# Patient Record
Sex: Female | Born: 1955 | Race: White | Hispanic: Yes | Marital: Married | Smoking: Never smoker
Health system: Southern US, Community
[De-identification: ages and names within clinical notes are randomized; demographics above are authoritative.]

---

## 2005-09-17 ENCOUNTER — Emergency Department: Payer: Self-pay | Admitting: Emergency Medicine

## 2005-09-18 ENCOUNTER — Ambulatory Visit: Payer: Self-pay | Admitting: Emergency Medicine

## 2005-09-26 ENCOUNTER — Ambulatory Visit: Payer: Self-pay | Admitting: General Surgery

## 2006-02-12 ENCOUNTER — Emergency Department: Payer: Self-pay | Admitting: Emergency Medicine

## 2008-02-17 ENCOUNTER — Ambulatory Visit: Payer: Self-pay | Admitting: Certified Nurse Midwife

## 2008-09-10 ENCOUNTER — Emergency Department: Payer: Self-pay | Admitting: Emergency Medicine

## 2013-01-21 HISTORY — PX: BREAST BIOPSY: SHX20

## 2013-10-27 ENCOUNTER — Ambulatory Visit: Payer: Self-pay

## 2013-12-09 ENCOUNTER — Ambulatory Visit: Payer: Self-pay

## 2013-12-15 ENCOUNTER — Ambulatory Visit: Payer: Self-pay

## 2014-05-06 ENCOUNTER — Other Ambulatory Visit: Payer: Self-pay | Admitting: Oncology

## 2014-05-06 DIAGNOSIS — R921 Mammographic calcification found on diagnostic imaging of breast: Secondary | ICD-10-CM

## 2014-05-16 LAB — SURGICAL PATHOLOGY

## 2014-06-03 ENCOUNTER — Ambulatory Visit: Admission: RE | Admit: 2014-06-03 | Payer: Self-pay | Source: Ambulatory Visit

## 2014-06-03 ENCOUNTER — Other Ambulatory Visit: Payer: Self-pay

## 2014-06-16 ENCOUNTER — Ambulatory Visit: Payer: Self-pay

## 2014-06-16 ENCOUNTER — Ambulatory Visit
Admission: RE | Admit: 2014-06-16 | Discharge: 2014-06-16 | Disposition: A | Discharge status: Home / Self Care | Payer: Self-pay | Source: Ambulatory Visit | Attending: Oncology | Admitting: Oncology

## 2014-06-16 ENCOUNTER — Other Ambulatory Visit: Payer: Self-pay | Admitting: Oncology

## 2014-06-16 DIAGNOSIS — R921 Mammographic calcification found on diagnostic imaging of breast: Secondary | ICD-10-CM

## 2014-06-22 ENCOUNTER — Encounter: Payer: Self-pay | Admitting: *Deleted

## 2014-06-22 NOTE — Progress Notes (Signed)
Talked to patient yesterday via the interpreter [place]. Patient with birads 3 mammogram.  Next appointment due for annual and 6 month f/u on 12/14/14 @ 8:00 in the [facility] clinic.  Will put in order at that time.

## 2014-09-20 ENCOUNTER — Telehealth: Payer: Self-pay | Admitting: *Deleted

## 2014-09-30 ENCOUNTER — Other Ambulatory Visit: Payer: Self-pay | Admitting: Internal Medicine

## 2014-09-30 DIAGNOSIS — R928 Other abnormal and inconclusive findings on diagnostic imaging of breast: Secondary | ICD-10-CM

## 2014-12-13 ENCOUNTER — Ambulatory Visit
Admission: RE | Admit: 2014-12-13 | Discharge: 2014-12-13 | Disposition: A | Discharge status: Home / Self Care | Payer: No Typology Code available for payment source | Source: Ambulatory Visit | Attending: Internal Medicine | Admitting: Internal Medicine

## 2014-12-13 DIAGNOSIS — R928 Other abnormal and inconclusive findings on diagnostic imaging of breast: Secondary | ICD-10-CM | POA: Diagnosis present

## 2014-12-13 DIAGNOSIS — R921 Mammographic calcification found on diagnostic imaging of breast: Secondary | ICD-10-CM | POA: Diagnosis not present

## 2014-12-21 ENCOUNTER — Ambulatory Visit: Payer: No Typology Code available for payment source

## 2015-11-21 ENCOUNTER — Other Ambulatory Visit: Payer: Self-pay | Admitting: Internal Medicine

## 2015-11-21 DIAGNOSIS — Z1231 Encounter for screening mammogram for malignant neoplasm of breast: Secondary | ICD-10-CM

## 2015-12-27 ENCOUNTER — Ambulatory Visit
Admission: RE | Admit: 2015-12-27 | Discharge: 2015-12-27 | Disposition: A | Discharge status: Home / Self Care | Payer: BLUE CROSS/BLUE SHIELD | Source: Ambulatory Visit | Attending: Internal Medicine | Admitting: Internal Medicine

## 2015-12-27 DIAGNOSIS — Z1231 Encounter for screening mammogram for malignant neoplasm of breast: Secondary | ICD-10-CM | POA: Insufficient documentation

## 2016-11-27 ENCOUNTER — Other Ambulatory Visit: Payer: Self-pay | Admitting: Internal Medicine

## 2016-11-27 DIAGNOSIS — Z1231 Encounter for screening mammogram for malignant neoplasm of breast: Secondary | ICD-10-CM

## 2016-12-31 ENCOUNTER — Ambulatory Visit
Admission: RE | Admit: 2016-12-31 | Discharge: 2016-12-31 | Disposition: A | Discharge status: Home / Self Care | Payer: BLUE CROSS/BLUE SHIELD | Source: Ambulatory Visit | Attending: Internal Medicine | Admitting: Internal Medicine

## 2016-12-31 DIAGNOSIS — Z1231 Encounter for screening mammogram for malignant neoplasm of breast: Secondary | ICD-10-CM | POA: Diagnosis present

## 2017-12-12 ENCOUNTER — Other Ambulatory Visit: Payer: Self-pay | Admitting: Internal Medicine

## 2017-12-12 DIAGNOSIS — Z1231 Encounter for screening mammogram for malignant neoplasm of breast: Secondary | ICD-10-CM

## 2018-01-30 ENCOUNTER — Ambulatory Visit
Admission: RE | Admit: 2018-01-30 | Discharge: 2018-01-30 | Disposition: A | Discharge status: Home / Self Care | Payer: BLUE CROSS/BLUE SHIELD | Source: Ambulatory Visit | Attending: Internal Medicine | Admitting: Internal Medicine

## 2018-01-30 DIAGNOSIS — Z1231 Encounter for screening mammogram for malignant neoplasm of breast: Secondary | ICD-10-CM | POA: Diagnosis present

## 2018-07-31 ENCOUNTER — Other Ambulatory Visit: Payer: Self-pay | Admitting: Internal Medicine

## 2018-07-31 DIAGNOSIS — G500 Trigeminal neuralgia: Secondary | ICD-10-CM

## 2018-08-13 ENCOUNTER — Other Ambulatory Visit: Payer: Self-pay

## 2018-08-13 ENCOUNTER — Ambulatory Visit
Admission: RE | Admit: 2018-08-13 | Discharge: 2018-08-13 | Disposition: A | Discharge status: Home / Self Care | Payer: BLUE CROSS/BLUE SHIELD | Source: Ambulatory Visit | Attending: Internal Medicine | Admitting: Internal Medicine

## 2018-08-13 DIAGNOSIS — G500 Trigeminal neuralgia: Secondary | ICD-10-CM | POA: Diagnosis present

## 2018-08-13 LAB — POCT I-STAT CREATININE: Creatinine, Ser: 0.8 mg/dL (ref 0.44–1.00)

## 2018-08-13 MED ORDER — GADOBUTROL 1 MMOL/ML IV SOLN
8.0000 mL | Freq: Once | INTRAVENOUS | Status: AC | PRN
  Administered 2018-08-13: 7.5 mL via INTRAVENOUS

## 2019-07-09 ENCOUNTER — Other Ambulatory Visit: Payer: Self-pay | Admitting: Internal Medicine

## 2019-07-09 DIAGNOSIS — Z1231 Encounter for screening mammogram for malignant neoplasm of breast: Secondary | ICD-10-CM

## 2019-07-22 ENCOUNTER — Ambulatory Visit
Admission: RE | Admit: 2019-07-22 | Discharge: 2019-07-22 | Disposition: A | Discharge status: Home / Self Care | Payer: BLUE CROSS/BLUE SHIELD | Source: Ambulatory Visit | Attending: Internal Medicine | Admitting: Internal Medicine

## 2019-07-22 DIAGNOSIS — Z1231 Encounter for screening mammogram for malignant neoplasm of breast: Secondary | ICD-10-CM | POA: Diagnosis present

## 2020-07-18 IMAGING — MG DIGITAL SCREENING BILAT W/ TOMO W/ CAD
8 series · 8 of 24 positions shown · non-contrast
Comparison: Previous exam(s).

CLINICAL DATA: Screening.

EXAM:
DIGITAL SCREENING BILATERAL MAMMOGRAM WITH TOMO AND CAD

[R CC synth-2D]
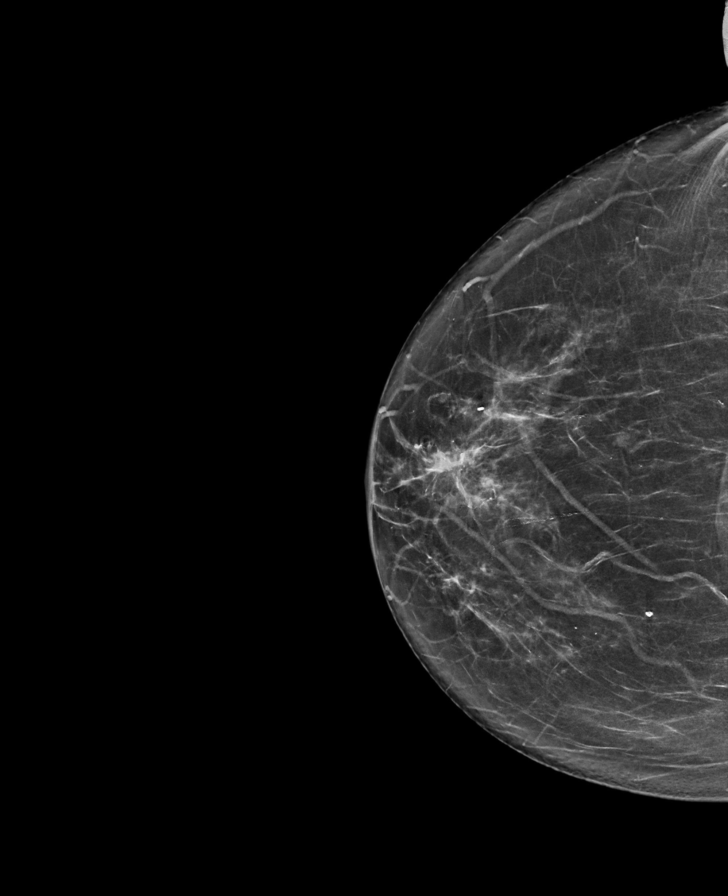

[L CC synth-2D]
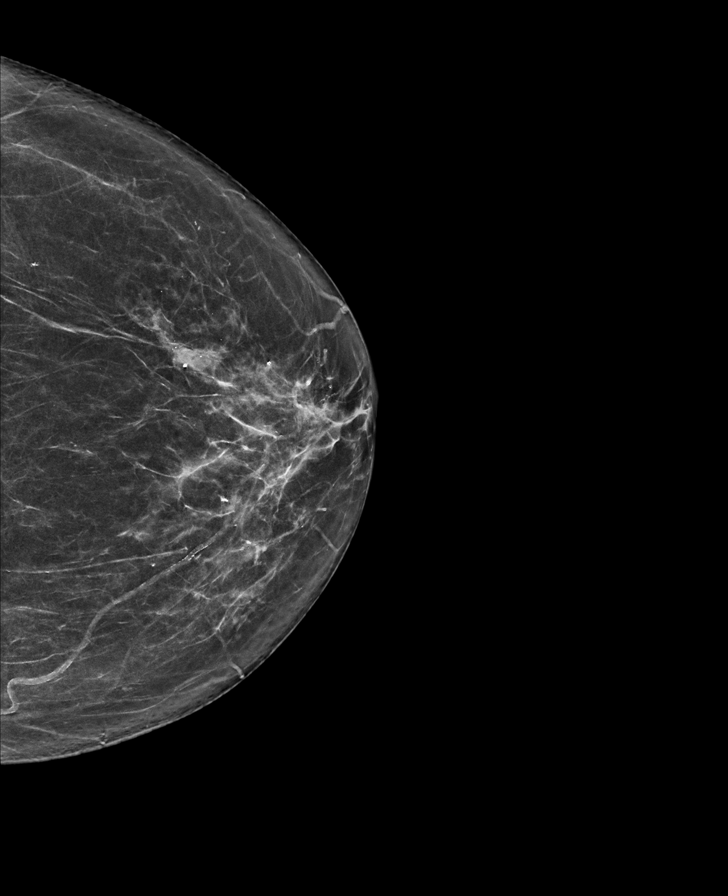

[R MLO synth-2D]
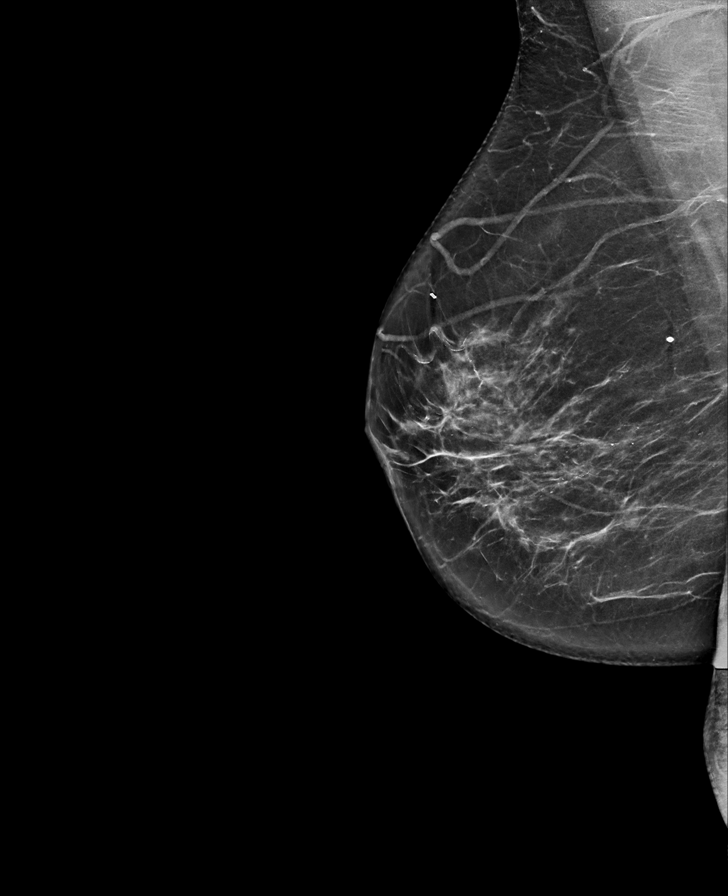

[L MLO synth-2D]
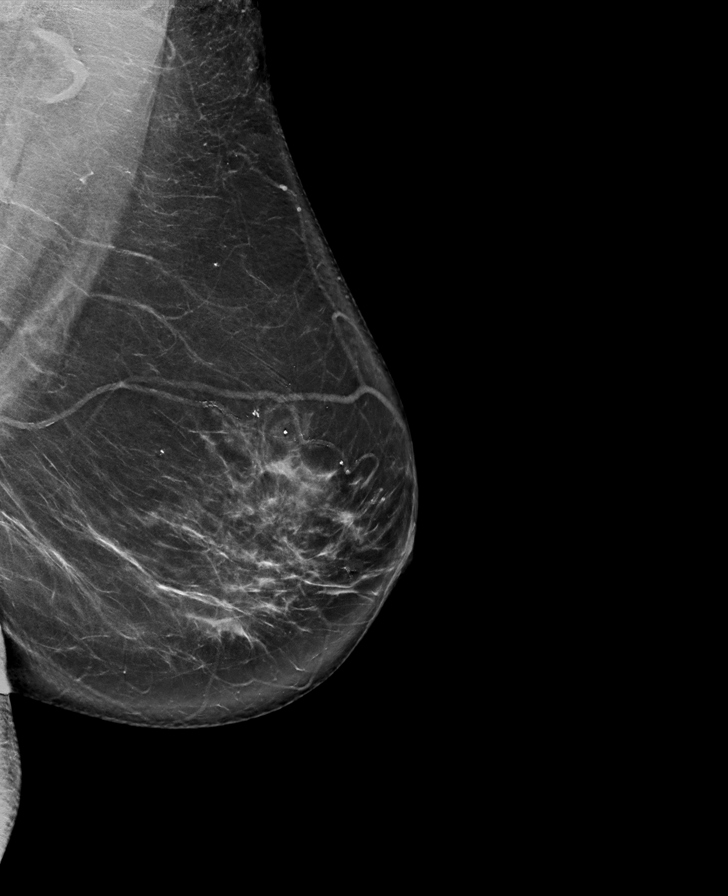

[L MLO tomo · tomo slice 44/87.0]
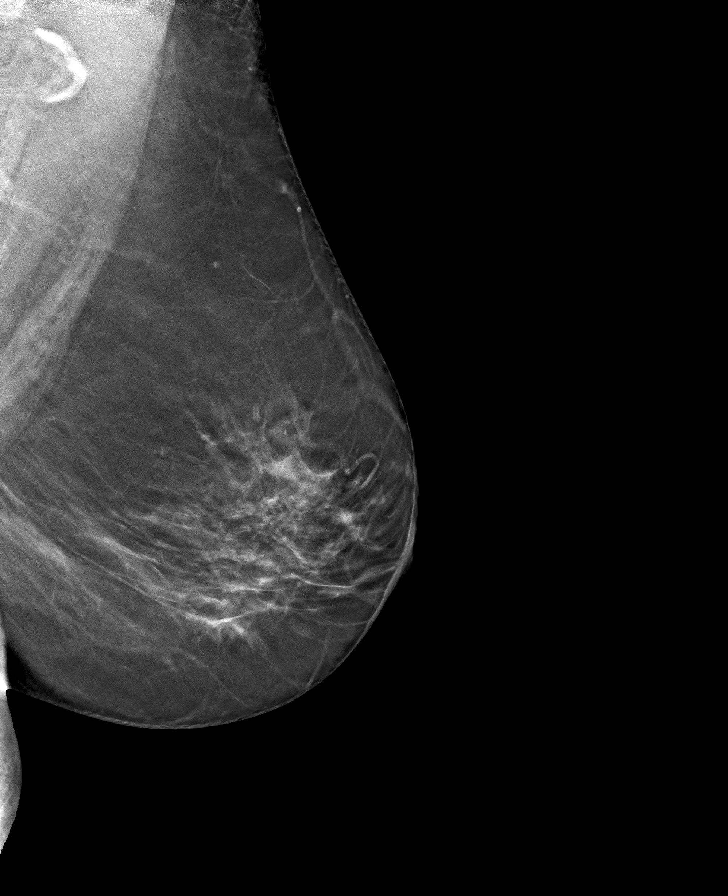

[R CC tomo · tomo slice 36/71.0]
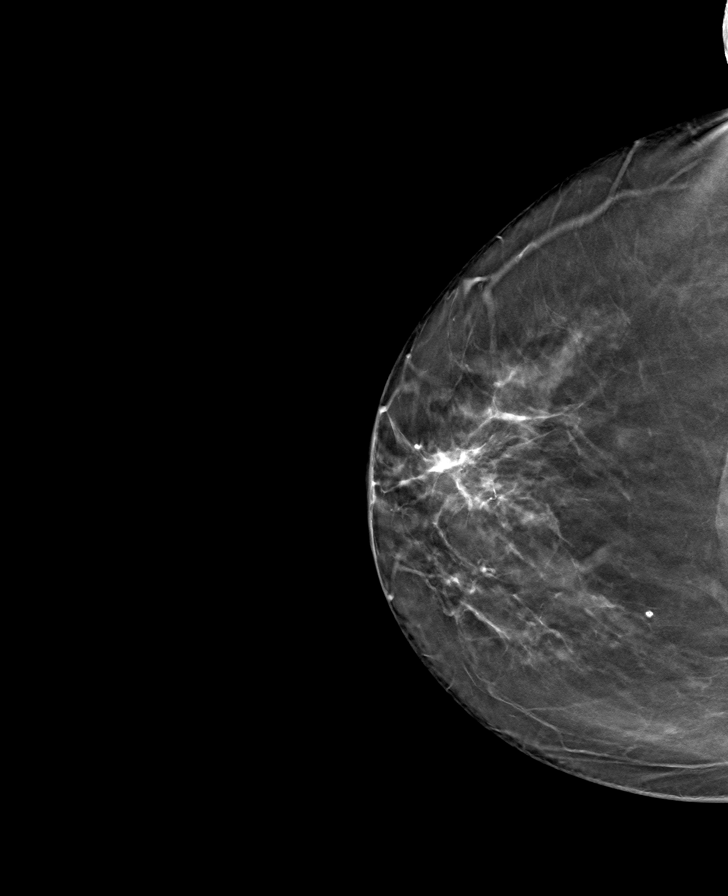

[R MLO tomo · tomo slice 40/79.0]
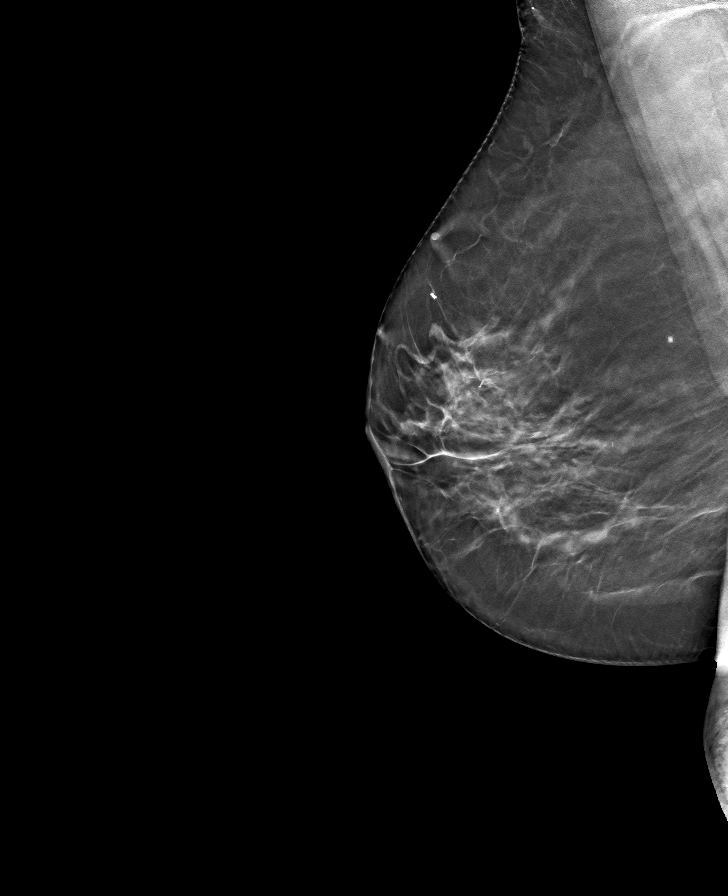

[L CC tomo · tomo slice 35/70.0]
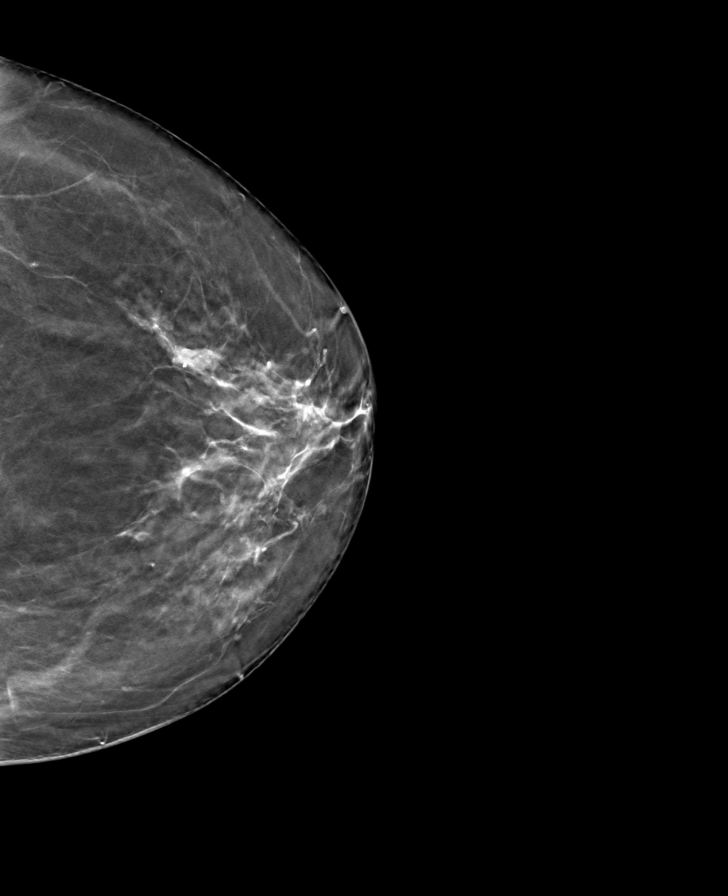

[8 of 24 positions shown; findings below may reference images not displayed]

ACR Breast Density Category b: There are scattered areas of
fibroglandular density.
FINDINGS: There are no findings suspicious for malignancy. Images were
processed with CAD.
IMPRESSION: No mammographic evidence of malignancy. A result letter of this
screening mammogram will be mailed directly to the patient.

RECOMMENDATION:
Screening mammogram in one year. (Code:[id number])

BI-RADS CATEGORY  1: Negative.

## 2021-03-16 ENCOUNTER — Other Ambulatory Visit: Payer: Self-pay

## 2021-03-16 ENCOUNTER — Emergency Department
Admission: EM | Admit: 2021-03-16 | Discharge: 2021-03-16 | Disposition: A | Discharge status: Home / Self Care | Payer: No Typology Code available for payment source | Attending: Emergency Medicine | Admitting: Emergency Medicine

## 2021-03-16 ENCOUNTER — Encounter: Payer: Self-pay | Admitting: Emergency Medicine

## 2021-03-16 DIAGNOSIS — N390 Urinary tract infection, site not specified: Secondary | ICD-10-CM | POA: Insufficient documentation

## 2021-03-16 DIAGNOSIS — R300 Dysuria: Secondary | ICD-10-CM | POA: Diagnosis present

## 2021-03-16 LAB — CBC WITH DIFFERENTIAL/PLATELET
Abs Immature Granulocytes: 0.01 10*3/uL (ref 0.00–0.07)
Basophils Absolute: 0 10*3/uL (ref 0.0–0.1)
Basophils Relative: 1 %
Eosinophils Absolute: 0.2 10*3/uL (ref 0.0–0.5)
Eosinophils Relative: 2 %
HCT: 40.4 % (ref 36.0–46.0)
Hemoglobin: 13.3 g/dL (ref 12.0–15.0)
Immature Granulocytes: 0 %
Lymphocytes Relative: 32 %
Lymphs Abs: 2.3 10*3/uL (ref 0.7–4.0)
MCH: 26.8 pg (ref 26.0–34.0)
MCHC: 32.9 g/dL (ref 30.0–36.0)
MCV: 81.5 fL (ref 80.0–100.0)
Monocytes Absolute: 0.6 10*3/uL (ref 0.1–1.0)
Monocytes Relative: 8 %
Neutro Abs: 4.2 10*3/uL (ref 1.7–7.7)
Neutrophils Relative %: 57 %
Platelets: 211 10*3/uL (ref 150–400)
RBC: 4.96 MIL/uL (ref 3.87–5.11)
RDW: 13.6 % (ref 11.5–15.5)
WBC: 7.3 10*3/uL (ref 4.0–10.5)
nRBC: 0 % (ref 0.0–0.2)

## 2021-03-16 LAB — URINALYSIS, ROUTINE W REFLEX MICROSCOPIC
Bilirubin Urine: NEGATIVE
Glucose, UA: NEGATIVE mg/dL
Ketones, ur: NEGATIVE mg/dL
Nitrite: NEGATIVE
Protein, ur: NEGATIVE mg/dL
Specific Gravity, Urine: 1.003 — ABNORMAL LOW (ref 1.005–1.030)
Squamous Epithelial / HPF: NONE SEEN (ref 0–5)
WBC, UA: >50 WBC/hpf — ABNORMAL HIGH (ref 0–5)
pH: 7 (ref 5.0–8.0)

## 2021-03-16 LAB — COMPREHENSIVE METABOLIC PANEL
ALT: 26 U/L (ref 0–44)
AST: 23 U/L (ref 15–41)
Albumin: 4.2 g/dL (ref 3.5–5.0)
Alkaline Phosphatase: 100 U/L (ref 38–126)
Anion gap: 8 (ref 5–15)
BUN: 14 mg/dL (ref 8–23)
CO2: 26 mmol/L (ref 22–32)
Calcium: 8.9 mg/dL (ref 8.9–10.3)
Chloride: 105 mmol/L (ref 98–111)
Creatinine, Ser: 0.73 mg/dL (ref 0.44–1.00)
GFR, Estimated: >60 mL/min (ref >60)
Glucose, Bld: 110 mg/dL — ABNORMAL HIGH (ref 70–99)
Potassium: 3.6 mmol/L (ref 3.5–5.1)
Sodium: 139 mmol/L (ref 135–145)
Total Bilirubin: 0.6 mg/dL (ref 0.3–1.2)
Total Protein: 7.3 g/dL (ref 6.5–8.1)

## 2021-03-16 LAB — LIPASE, BLOOD: Lipase: 42 U/L (ref 11–51)

## 2021-03-16 MED ORDER — CEPHALEXIN 500 MG PO CAPS
500.0000 mg | ORAL_CAPSULE | Freq: Once | ORAL | Status: AC
  Administered 2021-03-16: 500 mg via ORAL
  Filled 2021-03-16: qty 1

## 2021-03-16 MED ORDER — CEPHALEXIN 500 MG PO CAPS: 500.0000 mg | ORAL_CAPSULE | Freq: Three times a day (TID) | ORAL | 0 refills | Status: AC

## 2021-03-16 NOTE — ED Triage Notes (Addendum)
Patient ambulatory to triage with steady gait, without difficulty or distress noted; per stratus video interpreter, pt reports dysuria x 15days accomp by lower abd/back pain

## 2021-03-16 NOTE — Discharge Instructions (Signed)
Take the antibiotic 3 times a day for 7 days. Follow-up with your primary provider or return if needed. Return to the ED if needed.   Tome el antibitico 3 veces al da durante 7 [address]  [address]. Haga un seguimiento con su proveedor principal o regrese si es necesario. Regrese al servicio de urgencias si es necesario.

## 2021-03-16 NOTE — ED Provider Notes (Signed)
[facility] Emergency Department Provider Note     Event Date/Time   First MD Initiated Contact with Patient 03/16/21 [phone]     (approximate)   History   Abdominal Pain   HPI  History limited by Spanish language.  Tele-interpreter used for interview and exam.  Mary Parrish is a 66 y.o. female with a medical history including trigeminal neuralgia, presents to the ED with 2 weeks of dysuria and low back pain.  Patient denies any associated nausea, vomiting, or diarrhea patient also denies any fevers, chills, sweats, chest pain, shortness of breath.  She presents without complaints of urinary retention, or hematuria, but notes urinary frequency and nocturia.     Physical Exam   Triage Vital Signs: ED Triage Vitals  Enc Vitals Group     BP 03/16/21 0538 (!) 152/100     Pulse Rate 03/16/21 0538 75     Resp 03/16/21 0538 18     Temp --      Temp Source 03/16/21 0538 Oral     SpO2 03/16/21 0538 99 %     Weight 03/16/21 0539 188 lb (85.3 kg)     Height 03/16/21 0539 5\' 5"  (1.651 m)     Head Circumference --      Peak Flow --      Pain Score 03/16/21 0538 8     Pain Loc --      Pain Edu? --      Excl. in [place]? --     Most recent vital signs: Vitals:   03/16/21 0538 03/16/21 0744  BP: (!) 152/100 (!) 153/73  Pulse: 75 62  Resp: 18 18  Temp:  98.3 F (36.8 C)  SpO2: 99% 99%    General Awake, no distress.  CV:  Good peripheral perfusion.  RESP:  Normal effort.  ABD:  No distention. Soft, nontender. Normal bowel sounds. Mild right flank pain   ED Results / Procedures / Treatments   Labs (all labs ordered are listed, but only abnormal results are displayed) Labs Reviewed  COMPREHENSIVE METABOLIC PANEL - Abnormal; Notable for the following components:      Result Value   Glucose, Bld 110 (*)    All other components within normal limits  URINALYSIS, ROUTINE W REFLEX MICROSCOPIC - Abnormal; Notable for the following components:    Color, Urine YELLOW (*)    APPearance TURBID (*)    Specific Gravity, Urine 1.003 (*)    Hgb urine dipstick SMALL (*)    Leukocytes,Ua LARGE (*)    WBC, UA >50 (*)    Bacteria, UA RARE (*)    All other components within normal limits  URINE CULTURE  CBC WITH DIFFERENTIAL/PLATELET  LIPASE, BLOOD     EKG    RADIOLOGY  No results found.   PROCEDURES:  Critical Care performed: No  Procedures   MEDICATIONS ORDERED IN ED: Medications  cephALEXin (KEFLEX) capsule 500 mg (500 mg Oral Given 03/16/21 0815)     IMPRESSION / MDM / ASSESSMENT AND PLAN / ED COURSE  I reviewed the triage vital signs and the nursing notes.                              Differential diagnosis includes, but is not limited to, cystitis, pyelonephritis, nephrolithiasis, urinary retention, lumbar strain  Patient ED evaluation of 2 weeks of intermittent dysuria described as burning, as well as some mild  right flank pain.  Patient presents afebrile with vital signs otherwise stable, and in no acute distress.  He is evaluated for complaints, found to have a reassuring work-up overall.  No CBC evidence of leukocytosis or critical anemia.  BMP is reassuring as it shows no acute kidney injury or electrolyte abnormalities.  UA does show some leukocyturia without significant bacteriuria.  Urine culture is pending at this time.  Patient's diagnosis is consistent with acute cystitis, presumed infectious. Patient will be discharged home with prescriptions for Keflex.  Patient is stable at this time without signs of urosepsis or acute pyelonephritis, warranting admission, feel she is stable for outpatient management.  As such, patient is to follow up with her primary provider as needed or otherwise directed. Patient is given ED precautions to return to the ED for any worsening or new symptoms.  FINAL CLINICAL IMPRESSION(S) / ED DIAGNOSES   Final diagnoses:  Lower urinary tract infectious disease     Rx / DC  Orders   ED Discharge Orders          Ordered    cephALEXin (KEFLEX) 500 MG capsule  3 times daily        03/16/21 [id number]             Note:  This document was prepared using Dragon voice recognition software and may include unintentional dictation errors.    Melvenia Needles, PA-C 03/16/21 0816    Carrie Mew, MD 03/18/21 [phone]

## 2021-03-18 LAB — URINE CULTURE: Culture: >=100000 — AB

## 2021-03-19 ENCOUNTER — Telehealth: Payer: Self-pay

## 2021-03-19 NOTE — Telephone Encounter (Signed)
This patient presented to ED on 2/24 with ongoing flank pain and dysuria x2 weeks. UA was esterase positive and nitrite negative.  Urine cultures were then collected and patient was discharged with Cephalexin x7 days for UTI. Cultures now growing ESBL producing E.Coli resistant to keflex but susceptible to ciprofloxacin. Called patient to see if experiencing ongoing symptoms of UTI. Patient says she feels much better and no longer has vaginal pain or flank pain. I told her if symptoms were to reoccur, to contact the [facility] ED. MD and I have agreed to hold off on antibiotics given symptom improvement.  Selinda Eon, PharmD, MS PGPM Clinical Pharmacist 03/19/2021 12:05 PM

## 2021-06-21 ENCOUNTER — Other Ambulatory Visit: Payer: Self-pay | Admitting: Internal Medicine

## 2021-06-21 DIAGNOSIS — Z1231 Encounter for screening mammogram for malignant neoplasm of breast: Secondary | ICD-10-CM

## 2021-10-16 DIAGNOSIS — E538 Deficiency of other specified B group vitamins: Secondary | ICD-10-CM | POA: Diagnosis not present

## 2021-10-16 DIAGNOSIS — R251 Tremor, unspecified: Secondary | ICD-10-CM | POA: Diagnosis not present

## 2021-10-16 DIAGNOSIS — G500 Trigeminal neuralgia: Secondary | ICD-10-CM | POA: Diagnosis not present

## 2021-10-16 DIAGNOSIS — E559 Vitamin D deficiency, unspecified: Secondary | ICD-10-CM | POA: Diagnosis not present

## 2021-10-16 DIAGNOSIS — R519 Headache, unspecified: Secondary | ICD-10-CM | POA: Diagnosis not present

## 2021-10-16 DIAGNOSIS — Z79899 Other long term (current) drug therapy: Secondary | ICD-10-CM | POA: Diagnosis not present

## 2021-11-05 ENCOUNTER — Other Ambulatory Visit: Payer: Self-pay | Admitting: Physician Assistant

## 2021-11-05 DIAGNOSIS — G500 Trigeminal neuralgia: Secondary | ICD-10-CM

## 2021-11-05 DIAGNOSIS — S83232A Complex tear of medial meniscus, current injury, left knee, initial encounter: Secondary | ICD-10-CM | POA: Diagnosis not present

## 2021-11-05 DIAGNOSIS — M25562 Pain in left knee: Secondary | ICD-10-CM | POA: Diagnosis not present

## 2021-11-05 DIAGNOSIS — R519 Headache, unspecified: Secondary | ICD-10-CM

## 2021-11-06 ENCOUNTER — Other Ambulatory Visit: Payer: Self-pay | Admitting: Orthopedic Surgery

## 2021-11-06 DIAGNOSIS — S83232A Complex tear of medial meniscus, current injury, left knee, initial encounter: Secondary | ICD-10-CM

## 2021-11-15 DIAGNOSIS — M25562 Pain in left knee: Secondary | ICD-10-CM | POA: Diagnosis not present

## 2021-11-26 DIAGNOSIS — Z79899 Other long term (current) drug therapy: Secondary | ICD-10-CM | POA: Diagnosis not present

## 2021-11-26 DIAGNOSIS — R519 Headache, unspecified: Secondary | ICD-10-CM | POA: Diagnosis not present

## 2021-11-26 DIAGNOSIS — R251 Tremor, unspecified: Secondary | ICD-10-CM | POA: Diagnosis not present

## 2021-11-26 DIAGNOSIS — E559 Vitamin D deficiency, unspecified: Secondary | ICD-10-CM | POA: Diagnosis not present

## 2021-11-26 DIAGNOSIS — G500 Trigeminal neuralgia: Secondary | ICD-10-CM | POA: Diagnosis not present

## 2021-12-24 DIAGNOSIS — G250 Essential tremor: Secondary | ICD-10-CM | POA: Diagnosis not present

## 2021-12-24 DIAGNOSIS — E538 Deficiency of other specified B group vitamins: Secondary | ICD-10-CM | POA: Diagnosis not present

## 2021-12-24 DIAGNOSIS — G500 Trigeminal neuralgia: Secondary | ICD-10-CM | POA: Diagnosis not present

## 2021-12-24 DIAGNOSIS — M7021 Olecranon bursitis, right elbow: Secondary | ICD-10-CM | POA: Diagnosis not present

## 2021-12-27 ENCOUNTER — Encounter: Payer: Self-pay | Admitting: Physician Assistant

## 2021-12-27 DIAGNOSIS — G500 Trigeminal neuralgia: Secondary | ICD-10-CM

## 2022-02-03 ENCOUNTER — Emergency Department
Admission: EM | Admit: 2022-02-03 | Discharge: 2022-02-03 | Disposition: A | Discharge status: Home / Self Care | Payer: 59 | Attending: Emergency Medicine | Admitting: Emergency Medicine

## 2022-02-03 ENCOUNTER — Other Ambulatory Visit: Payer: Self-pay

## 2022-02-03 DIAGNOSIS — G894 Chronic pain syndrome: Secondary | ICD-10-CM | POA: Diagnosis not present

## 2022-02-03 DIAGNOSIS — G501 Atypical facial pain: Secondary | ICD-10-CM | POA: Diagnosis not present

## 2022-02-03 DIAGNOSIS — G500 Trigeminal neuralgia: Secondary | ICD-10-CM | POA: Insufficient documentation

## 2022-02-03 LAB — CBC WITH DIFFERENTIAL/PLATELET
Abs Immature Granulocytes: 0.07 10*3/uL (ref 0.00–0.07)
Basophils Absolute: 0 10*3/uL (ref 0.0–0.1)
Basophils Relative: 0 %
Eosinophils Absolute: 0 10*3/uL (ref 0.0–0.5)
Eosinophils Relative: 0 %
HCT: 42.6 % (ref 36.0–46.0)
Hemoglobin: 13.8 g/dL (ref 12.0–15.0)
Immature Granulocytes: 1 %
Lymphocytes Relative: 23 %
Lymphs Abs: 2.7 10*3/uL (ref 0.7–4.0)
MCH: 26.4 pg (ref 26.0–34.0)
MCHC: 32.4 g/dL (ref 30.0–36.0)
MCV: 81.5 fL (ref 80.0–100.0)
Monocytes Absolute: 0.7 10*3/uL (ref 0.1–1.0)
Monocytes Relative: 6 %
Neutro Abs: 8.3 10*3/uL — ABNORMAL HIGH (ref 1.7–7.7)
Neutrophils Relative %: 70 %
Platelets: 254 10*3/uL (ref 150–400)
RBC: 5.23 MIL/uL — ABNORMAL HIGH (ref 3.87–5.11)
RDW: 14.2 % (ref 11.5–15.5)
WBC: 11.8 10*3/uL — ABNORMAL HIGH (ref 4.0–10.5)
nRBC: 0 % (ref 0.0–0.2)

## 2022-02-03 LAB — BASIC METABOLIC PANEL
Anion gap: 7 (ref 5–15)
BUN: 16 mg/dL (ref 8–23)
CO2: 23 mmol/L (ref 22–32)
Calcium: 9.1 mg/dL (ref 8.9–10.3)
Chloride: 107 mmol/L (ref 98–111)
Creatinine, Ser: 0.63 mg/dL (ref 0.44–1.00)
GFR, Estimated: >60 mL/min (ref >60)
Glucose, Bld: 83 mg/dL (ref 70–99)
Potassium: 3.8 mmol/L (ref 3.5–5.1)
Sodium: 137 mmol/L (ref 135–145)

## 2022-02-03 MED ORDER — HYDROCODONE-ACETAMINOPHEN 5-325 MG PO TABS: 1.0000 | ORAL_TABLET | Freq: Four times a day (QID) | ORAL | 0 refills | Status: AC | PRN

## 2022-02-03 MED ORDER — ACETAMINOPHEN 325 MG PO TABS
650.0000 mg | ORAL_TABLET | Freq: Once | ORAL | Status: AC
  Administered 2022-02-03: 650 mg via ORAL
  Filled 2022-02-03: qty 2

## 2022-02-03 NOTE — Discharge Instructions (Signed)
Call and make an appointment with Dr. Melrose Nakayama at [facility] Neurology department. The medication prescribed for you today is for pain only.  Do not drive while taking this.  Continue taking your regular medications.

## 2022-02-03 NOTE — ED Provider Notes (Signed)
[name] Provider Note    None    (approximate)   History   Facial Pain   HPI Spanish interpreter [place] at [facility]. Mary Parrish is a 67 y.o. female   presents to the ED with complaint of left-sided facial pain and a history of trigeminal neuralgia which she is seeing neurology at [name].  Looking through her records she is not currently taking all the medications listed at [name] for this.  She states nothing is changed but occasionally she does have left-sided facial pain that is not controlled with over-the-counter medication.  Denies any visual changes, dizziness, fever, chills or balance issues.      Physical Exam   Triage Vital Signs: ED Triage Vitals  Enc Vitals Group     BP 02/03/22 0843 (!) 154/77     Pulse Rate 02/03/22 0843 65     Resp 02/03/22 0843 16     Temp 02/03/22 0843 (!) 97.5 F (36.4 C)     Temp Source 02/03/22 0843 Oral     SpO2 02/03/22 0843 97 %     Weight 02/03/22 0855 195 lb (88.5 kg)     Height 02/03/22 0855 5' 1.02" (1.55 m)     Head Circumference --      Peak Flow --      Pain Score 02/03/22 0852 6     Pain Loc --      Pain Edu? --      Excl. in [place]? --     Most recent vital signs: Vitals:   02/03/22 0843  BP: (!) 154/77  Pulse: 65  Resp: 16  Temp: (!) 97.5 F (36.4 C)  SpO2: 97%     General: Awake, no distress.  Patient is alert, talkative, cooperative. CV:  Good peripheral perfusion.  Resp:  Normal effort.  Lungs are clear bilaterally. Abd:  No distention.  Other:  Examination of the face there is no drooping or deformity present.  Nontender to palpation temporal or periorbital area.  Skin is intact.  No erythema or discoloration present.  PERRLA, EOMI's.   ED Results / Procedures / Treatments   Labs (all labs ordered are listed, but only abnormal results are displayed) Labs Reviewed  CBC WITH DIFFERENTIAL/PLATELET - Abnormal; Notable for the following components:       Result Value   WBC 11.8 (*)    RBC 5.23 (*)    Neutro Abs 8.3 (*)    All other components within normal limits  BASIC METABOLIC PANEL       PROCEDURES:  Critical Care performed:   Procedures   MEDICATIONS ORDERED IN ED: Medications  acetaminophen (TYLENOL) tablet 650 mg (650 mg Oral Given 02/03/22 1023)     IMPRESSION / MDM / ASSESSMENT AND PLAN / ED COURSE  I reviewed the triage vital signs and the nursing notes.   Differential diagnosis includes, but is not limited to, exacerbation of her trigeminal neuralgia pain.  67 year old female presents to the ED with complaint of chronic left-sided facial pain secondary to trigeminal neuralgia for which she is being seen at the neurology department at [name].  Patient currently continues to take medication as prescribed and listed in the notes for [name].  These were reviewed and she acknowledges that she still continues to take them.  Lab work was reassuring and patient was made aware with the Spanish interpreter for [name] being used to convey this.  I explained that there  is very little that can be done since this is a chronic issue however a small quantity of pain medication could be sent to the pharmacy to take only as needed and she is aware that she cannot drive or operate machinery while taking this medication.  She is strongly encouraged to call Monday to the neurology department and let them know that she continues to have pain despite her current medications.      Patient's presentation is most consistent with acute complicated illness / injury requiring diagnostic workup.  FINAL CLINICAL IMPRESSION(S) / ED DIAGNOSES   Final diagnoses:  Trigeminal neuralgia of left side of face  Chronic pain syndrome     Rx / DC Orders   ED Discharge Orders          Ordered    HYDROcodone-acetaminophen (NORCO/VICODIN) 5-325 MG tablet  Every 6 hours PRN        02/03/22 1128             Note:  This  document was prepared using Dragon voice recognition software and may include unintentional dictation errors.   Johnn Hai, PA-C 02/03/22 1325    Nance Pear, MD 02/03/22 [phone]

## 2022-02-03 NOTE — ED Triage Notes (Addendum)
Pt states being seen by a neurologist for trigeminal pain in her nerve. Pt states her left face has been hurting for the last 24 hours, and now it is painful to talk or touch. Pt states difficulty sleeping due to the pain as well. Pt states the pain comes and goes and that is why she is being seen by a neurologist.   [facility] interpreter # [phone]

## 2022-02-12 DIAGNOSIS — R251 Tremor, unspecified: Secondary | ICD-10-CM | POA: Diagnosis not present

## 2022-02-12 DIAGNOSIS — Z79899 Other long term (current) drug therapy: Secondary | ICD-10-CM | POA: Diagnosis not present

## 2022-02-12 DIAGNOSIS — R519 Headache, unspecified: Secondary | ICD-10-CM | POA: Diagnosis not present

## 2022-02-12 DIAGNOSIS — G500 Trigeminal neuralgia: Secondary | ICD-10-CM | POA: Diagnosis not present

## 2022-02-12 DIAGNOSIS — M545 Low back pain, unspecified: Secondary | ICD-10-CM | POA: Diagnosis not present

## 2022-02-12 DIAGNOSIS — G8929 Other chronic pain: Secondary | ICD-10-CM | POA: Diagnosis not present

## 2022-02-15 ENCOUNTER — Other Ambulatory Visit: Payer: Self-pay | Admitting: Physician Assistant

## 2022-02-15 DIAGNOSIS — R519 Headache, unspecified: Secondary | ICD-10-CM

## 2022-02-15 DIAGNOSIS — G500 Trigeminal neuralgia: Secondary | ICD-10-CM

## 2022-03-05 ENCOUNTER — Ambulatory Visit
Admission: RE | Admit: 2022-03-05 | Discharge: 2022-03-05 | Disposition: A | Discharge status: Home / Self Care | Payer: 59 | Source: Ambulatory Visit | Attending: Physician Assistant | Admitting: Physician Assistant

## 2022-03-05 DIAGNOSIS — R519 Headache, unspecified: Secondary | ICD-10-CM

## 2022-03-05 DIAGNOSIS — G500 Trigeminal neuralgia: Secondary | ICD-10-CM

## 2022-03-05 MED ORDER — GADOPICLENOL 0.5 MMOL/ML IV SOLN
9.0000 mL | Freq: Once | INTRAVENOUS | Status: AC | PRN
  Administered 2022-03-05: 9 mL via INTRAVENOUS

## 2022-03-29 DIAGNOSIS — G500 Trigeminal neuralgia: Secondary | ICD-10-CM | POA: Diagnosis not present

## 2022-04-01 DIAGNOSIS — R519 Headache, unspecified: Secondary | ICD-10-CM | POA: Diagnosis not present

## 2022-04-01 DIAGNOSIS — G500 Trigeminal neuralgia: Secondary | ICD-10-CM | POA: Diagnosis not present

## 2022-04-01 DIAGNOSIS — R251 Tremor, unspecified: Secondary | ICD-10-CM | POA: Diagnosis not present

## 2022-05-14 DIAGNOSIS — G500 Trigeminal neuralgia: Secondary | ICD-10-CM | POA: Diagnosis not present

## 2022-05-14 DIAGNOSIS — Z510 Encounter for antineoplastic radiation therapy: Secondary | ICD-10-CM | POA: Diagnosis not present

## 2022-05-28 DIAGNOSIS — R350 Frequency of micturition: Secondary | ICD-10-CM | POA: Diagnosis not present

## 2022-05-28 DIAGNOSIS — R8290 Unspecified abnormal findings in urine: Secondary | ICD-10-CM | POA: Diagnosis not present

## 2022-05-28 DIAGNOSIS — R300 Dysuria: Secondary | ICD-10-CM | POA: Diagnosis not present

## 2022-07-01 DIAGNOSIS — G250 Essential tremor: Secondary | ICD-10-CM | POA: Diagnosis not present

## 2022-07-01 DIAGNOSIS — E559 Vitamin D deficiency, unspecified: Secondary | ICD-10-CM | POA: Diagnosis not present

## 2022-07-01 DIAGNOSIS — G500 Trigeminal neuralgia: Secondary | ICD-10-CM | POA: Diagnosis not present

## 2022-07-01 DIAGNOSIS — E538 Deficiency of other specified B group vitamins: Secondary | ICD-10-CM | POA: Diagnosis not present

## 2022-07-01 DIAGNOSIS — Z780 Asymptomatic menopausal state: Secondary | ICD-10-CM | POA: Diagnosis not present

## 2022-07-01 DIAGNOSIS — R2689 Other abnormalities of gait and mobility: Secondary | ICD-10-CM | POA: Diagnosis not present

## 2022-07-01 DIAGNOSIS — Z1231 Encounter for screening mammogram for malignant neoplasm of breast: Secondary | ICD-10-CM | POA: Diagnosis not present

## 2022-07-01 DIAGNOSIS — R519 Headache, unspecified: Secondary | ICD-10-CM | POA: Diagnosis not present

## 2022-07-04 ENCOUNTER — Other Ambulatory Visit: Payer: Self-pay

## 2022-07-04 DIAGNOSIS — Z1231 Encounter for screening mammogram for malignant neoplasm of breast: Secondary | ICD-10-CM

## 2022-07-04 DIAGNOSIS — E538 Deficiency of other specified B group vitamins: Secondary | ICD-10-CM | POA: Diagnosis not present

## 2022-07-04 DIAGNOSIS — Z0000 Encounter for general adult medical examination without abnormal findings: Secondary | ICD-10-CM | POA: Diagnosis not present

## 2022-07-04 DIAGNOSIS — E559 Vitamin D deficiency, unspecified: Secondary | ICD-10-CM | POA: Diagnosis not present

## 2022-07-08 DIAGNOSIS — M810 Age-related osteoporosis without current pathological fracture: Secondary | ICD-10-CM | POA: Diagnosis not present

## 2022-07-29 DIAGNOSIS — R29898 Other symptoms and signs involving the musculoskeletal system: Secondary | ICD-10-CM | POA: Diagnosis not present

## 2022-08-15 DIAGNOSIS — G500 Trigeminal neuralgia: Secondary | ICD-10-CM | POA: Diagnosis not present

## 2022-08-15 DIAGNOSIS — Z923 Personal history of irradiation: Secondary | ICD-10-CM | POA: Diagnosis not present

## 2022-10-07 DIAGNOSIS — G250 Essential tremor: Secondary | ICD-10-CM | POA: Diagnosis not present

## 2022-10-07 DIAGNOSIS — R29898 Other symptoms and signs involving the musculoskeletal system: Secondary | ICD-10-CM | POA: Diagnosis not present

## 2022-10-07 DIAGNOSIS — Z79899 Other long term (current) drug therapy: Secondary | ICD-10-CM | POA: Diagnosis not present

## 2022-10-07 DIAGNOSIS — G500 Trigeminal neuralgia: Secondary | ICD-10-CM | POA: Diagnosis not present

## 2022-10-07 DIAGNOSIS — R519 Headache, unspecified: Secondary | ICD-10-CM | POA: Diagnosis not present

## 2022-10-07 DIAGNOSIS — R2689 Other abnormalities of gait and mobility: Secondary | ICD-10-CM | POA: Diagnosis not present

## 2022-10-11 DIAGNOSIS — R300 Dysuria: Secondary | ICD-10-CM | POA: Diagnosis not present

## 2022-12-02 DIAGNOSIS — G500 Trigeminal neuralgia: Secondary | ICD-10-CM | POA: Diagnosis not present

## 2022-12-02 DIAGNOSIS — Z79899 Other long term (current) drug therapy: Secondary | ICD-10-CM | POA: Diagnosis not present

## 2022-12-02 DIAGNOSIS — G250 Essential tremor: Secondary | ICD-10-CM | POA: Diagnosis not present

## 2022-12-02 DIAGNOSIS — R519 Headache, unspecified: Secondary | ICD-10-CM | POA: Diagnosis not present

## 2022-12-02 DIAGNOSIS — R29898 Other symptoms and signs involving the musculoskeletal system: Secondary | ICD-10-CM | POA: Diagnosis not present

## 2022-12-02 DIAGNOSIS — R2689 Other abnormalities of gait and mobility: Secondary | ICD-10-CM | POA: Diagnosis not present

## 2022-12-17 DIAGNOSIS — Z923 Personal history of irradiation: Secondary | ICD-10-CM | POA: Diagnosis not present

## 2022-12-17 DIAGNOSIS — G500 Trigeminal neuralgia: Secondary | ICD-10-CM | POA: Diagnosis not present

## 2023-01-01 DIAGNOSIS — Z23 Encounter for immunization: Secondary | ICD-10-CM | POA: Diagnosis not present

## 2023-01-01 DIAGNOSIS — E538 Deficiency of other specified B group vitamins: Secondary | ICD-10-CM | POA: Diagnosis not present

## 2023-01-01 DIAGNOSIS — G250 Essential tremor: Secondary | ICD-10-CM | POA: Diagnosis not present

## 2023-01-01 DIAGNOSIS — G500 Trigeminal neuralgia: Secondary | ICD-10-CM | POA: Diagnosis not present

## 2023-03-17 ENCOUNTER — Ambulatory Visit
Admission: RE | Admit: 2023-03-17 | Discharge: 2023-03-17 | Disposition: A | Discharge status: Home / Self Care | Payer: No Typology Code available for payment source | Source: Ambulatory Visit | Attending: Internal Medicine | Admitting: Internal Medicine

## 2023-03-17 DIAGNOSIS — Z1231 Encounter for screening mammogram for malignant neoplasm of breast: Secondary | ICD-10-CM | POA: Insufficient documentation

## 2023-09-30 ENCOUNTER — Telehealth: Payer: Self-pay | Admitting: Physician Assistant

## 2023-09-30 NOTE — Telephone Encounter (Signed)
 Called pt and unable to leave vm. No voicemail set up. Pt need appt with Gretta for right knee pains see attach referral

## 2023-10-07 ENCOUNTER — Telehealth: Payer: Self-pay | Admitting: Physician Assistant

## 2023-10-07 NOTE — Telephone Encounter (Signed)
 Called pt and stating she went to another provider. Referral not needed. Please close referral

## 2023-11-10 ENCOUNTER — Ambulatory Visit: Admitting: Orthopedic Surgery

## 2023-11-10 DIAGNOSIS — M65342 Trigger finger, left ring finger: Secondary | ICD-10-CM | POA: Diagnosis not present

## 2023-11-10 MED ORDER — BETAMETHASONE SOD PHOS & ACET 6 (3-3) MG/ML IJ SUSP
6.0000 mg | INTRAMUSCULAR | Status: AC | PRN
  Administered 2023-11-10: 6 mg via INTRA_ARTICULAR

## 2023-11-10 MED ORDER — LIDOCAINE HCL 1 % IJ SOLN
1.0000 mL | INTRAMUSCULAR | Status: AC | PRN
  Administered 2023-11-10: 1 mL

## 2023-11-10 NOTE — Progress Notes (Signed)
 Mary Parrish - 68 y.o. female MRN [id number]  Date of birth: [birth year]  Office Visit Note: Visit Date: 11/10/2023 PCP: [facility] Referred by: Sameul Debby DEL, MD  Subjective: No chief complaint on file.  HPI: Mary Parrish is a pleasant 68 y.o. female who presents today for left hand ring finger trigger digit.  Symptoms have been present now for roughly 3 months.  She is having notable clicking and locking on a regular basis, often requiring manual correction.  Has not undergone any prior workup or treatment.  Pertinent ROS were reviewed with the patient and found to be negative unless otherwise specified above in HPI.   Visit Reason: left hand; ring finger Duration of symptoms:  3 months Hand dominance: right Occupation: retired Diabetic: No Smoking: No Heart/Lung History: none Blood Thinners:  none  Prior Testing/EMG: none Injections (Date): none Treatments: none Prior Surgery: none    Assessment & Plan: Visit Diagnoses:  1. Trigger finger, left ring finger     Plan: Extensive discussion was had with the patient today regarding her left ring trigger digit.  We discussed the etiology and pathophysiology of stenosing tenosynovitis.  We discussed conservative versus surgical treatment modalities.  From a conservative standpoint, we discussed activity modification, splinting, therapy and injections.  From a surgical standpoint, we discussed the possibility for trigger digit release as well as all risk and benefits associated.  Given that she has not trialed conservative treatments, patient is appropriate candidate for cortisone injection to the left ring finger A1 pulley for symptom relief.  Risks and benefit of the cortisone injection were discussed in detail, patient agreed to proceed.  Injection was provided today without issue, patient will return in approximate 6 weeks time for a recheck.   Follow-up: No follow-ups on file.   Meds  & Orders: No orders of the defined types were placed in this encounter.  No orders of the defined types were placed in this encounter.    Procedures: Hand/UE Inj: L ring A1 for trigger finger on 11/10/2023 9:49 AM Indications: pain Details: 25 G needle, volar approach Medications: 1 mL lidocaine 1 %; 6 mg betamethasone acetate-betamethasone sodium phosphate 6 (3-3) MG/ML Outcome: tolerated well, no immediate complications Consent was given by the patient. Patient was prepped and draped in the usual sterile fashion.          Clinical History: No specialty comments available.  She reports that she has never smoked. She has never used smokeless tobacco. No results for input(s): HGBA1C, LABURIC in the last 8760 hours.  Objective:   Vital Signs: There were no vitals taken for this visit.  Physical Exam  Gen: Well-appearing, in no acute distress; non-toxic CV: Regular Rate. Well-perfused. Warm.  Resp: Breathing unlabored on room air; no wheezing. Psych: Fluid speech in conversation; appropriate affect; normal thought process  Ortho Exam Left hand: - Palpable nodule at the A1 pulley of the ring finger, associated tenderness - Notable clicking with deep flexion of the ring finger, there is evidence of significant locking with deep flexion - Sensation intact distally, hand remains warm well-perfused    Imaging: No results found.  Past Medical/Family/Surgical/Social History: Medications & Allergies reviewed per EMR, new medications updated. There are no active problems to display for this patient.  No past medical history on file. Family History  Problem Relation Age of Onset   Breast cancer Neg Hx    Past Surgical History:  Procedure Laterality Date   BREAST BIOPSY  Right 2015   Fibroadenoma   Social History   Occupational History   Not on file  Tobacco Use   Smoking status: Never   Smokeless tobacco: Never  Vaping Use   Vaping status: Never Used  Substance and  Sexual Activity   Alcohol use: Never   Drug use: Never   Sexual activity: Not on file    Charles Niese Afton Alderton, M.D. [facility], Hand Surgery

## 2023-12-29 ENCOUNTER — Ambulatory Visit: Admitting: Orthopedic Surgery

## 2023-12-29 NOTE — Progress Notes (Deleted)
 Left ring finger trigger digit injection follow up

## 2024-01-27 ENCOUNTER — Ambulatory Visit
Admission: RE | Admit: 2024-01-27 | Discharge: 2024-01-27 | Disposition: A | Discharge status: Home / Self Care | Attending: Cardiology | Admitting: Cardiology

## 2024-01-27 ENCOUNTER — Ambulatory Visit
Admission: RE | Admit: 2024-01-27 | Discharge: 2024-01-27 | Disposition: A | Discharge status: Home / Self Care | Source: Ambulatory Visit | Attending: Primary Care | Admitting: Primary Care

## 2024-01-27 DIAGNOSIS — R059 Cough, unspecified: Secondary | ICD-10-CM | POA: Diagnosis present
